# Patient Record
Sex: Male | Born: 1970 | Race: Black or African American | Hispanic: No | Marital: Married | Smoking: Former smoker
Health system: Southern US, Community
[De-identification: ages and names within clinical notes are randomized; demographics above are authoritative.]

## PROBLEM LIST (undated history)

## (undated) ENCOUNTER — Ambulatory Visit

## (undated) DIAGNOSIS — I10 Essential (primary) hypertension: Secondary | ICD-10-CM

## (undated) DIAGNOSIS — B085 Enteroviral vesicular pharyngitis: Secondary | ICD-10-CM

## (undated) HISTORY — DX: Enteroviral vesicular pharyngitis: B08.5

## (undated) HISTORY — PX: BLADDER SURGERY: SHX569

---

## 1999-01-04 ENCOUNTER — Encounter: Payer: Self-pay | Admitting: Emergency Medicine

## 1999-01-04 ENCOUNTER — Emergency Department (HOSPITAL_COMMUNITY): Admission: EM | Admit: 1999-01-04 | Discharge: 1999-01-04 | Payer: Self-pay | Admitting: Emergency Medicine

## 1999-01-05 ENCOUNTER — Emergency Department (HOSPITAL_COMMUNITY): Admission: EM | Admit: 1999-01-05 | Discharge: 1999-01-06 | Payer: Self-pay | Admitting: Emergency Medicine

## 1999-02-09 ENCOUNTER — Emergency Department (HOSPITAL_COMMUNITY): Admission: EM | Admit: 1999-02-09 | Discharge: 1999-02-09 | Payer: Self-pay

## 1999-12-27 ENCOUNTER — Emergency Department (HOSPITAL_COMMUNITY): Admission: EM | Admit: 1999-12-27 | Discharge: 1999-12-27 | Payer: Self-pay | Admitting: Emergency Medicine

## 2011-06-23 ENCOUNTER — Ambulatory Visit: Payer: Self-pay

## 2017-03-07 ENCOUNTER — Encounter (HOSPITAL_COMMUNITY): Payer: Self-pay | Admitting: *Deleted

## 2017-03-07 ENCOUNTER — Emergency Department (HOSPITAL_COMMUNITY)
Admission: EM | Admit: 2017-03-07 | Discharge: 2017-03-07 | Disposition: A | Discharge status: Home / Self Care | Payer: BLUE CROSS/BLUE SHIELD | Attending: Emergency Medicine | Admitting: Emergency Medicine

## 2017-03-07 DIAGNOSIS — Z0289 Encounter for other administrative examinations: Secondary | ICD-10-CM | POA: Insufficient documentation

## 2017-03-07 DIAGNOSIS — Z87891 Personal history of nicotine dependence: Secondary | ICD-10-CM | POA: Insufficient documentation

## 2017-03-07 DIAGNOSIS — Z0283 Encounter for blood-alcohol and blood-drug test: Secondary | ICD-10-CM
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
## 2017-03-07 NOTE — Discharge Instructions (Signed)
Go to lab for drug screening.

## 2018-06-07 ENCOUNTER — Encounter (HOSPITAL_BASED_OUTPATIENT_CLINIC_OR_DEPARTMENT_OTHER): Payer: Self-pay | Admitting: *Deleted

## 2018-06-07 ENCOUNTER — Emergency Department (HOSPITAL_BASED_OUTPATIENT_CLINIC_OR_DEPARTMENT_OTHER)
Admission: EM | Admit: 2018-06-07 | Discharge: 2018-06-07 | Disposition: A | Discharge status: Home / Self Care | Payer: BLUE CROSS/BLUE SHIELD | Attending: Emergency Medicine | Admitting: Emergency Medicine

## 2018-06-07 ENCOUNTER — Other Ambulatory Visit: Payer: Self-pay

## 2018-06-07 ENCOUNTER — Emergency Department (HOSPITAL_BASED_OUTPATIENT_CLINIC_OR_DEPARTMENT_OTHER): Payer: BLUE CROSS/BLUE SHIELD

## 2018-06-07 DIAGNOSIS — M79671 Pain in right foot: Secondary | ICD-10-CM | POA: Insufficient documentation

## 2018-06-07 DIAGNOSIS — R202 Paresthesia of skin: Secondary | ICD-10-CM | POA: Insufficient documentation

## 2018-06-07 DIAGNOSIS — Y9389 Activity, other specified: Secondary | ICD-10-CM | POA: Insufficient documentation

## 2018-06-07 DIAGNOSIS — Y929 Unspecified place or not applicable: Secondary | ICD-10-CM | POA: Insufficient documentation

## 2018-06-07 DIAGNOSIS — I10 Essential (primary) hypertension: Secondary | ICD-10-CM | POA: Insufficient documentation

## 2018-06-07 DIAGNOSIS — Y990 Civilian activity done for income or pay: Secondary | ICD-10-CM | POA: Insufficient documentation

## 2018-06-07 DIAGNOSIS — Z87891 Personal history of nicotine dependence: Secondary | ICD-10-CM | POA: Insufficient documentation

## 2018-06-07 DIAGNOSIS — X503XXA Overexertion from repetitive movements, initial encounter: Secondary | ICD-10-CM | POA: Diagnosis not present

## 2018-06-07 DIAGNOSIS — Z79899 Other long term (current) drug therapy: Secondary | ICD-10-CM | POA: Diagnosis not present

## 2018-06-07 HISTORY — DX: Essential (primary) hypertension: I10

## 2018-06-07 MED ORDER — NAPROXEN 500 MG PO TABS: 500.0000 mg | ORAL_TABLET | Freq: Two times a day (BID) | ORAL | 0 refills | Status: DC

## 2018-06-07 NOTE — ED Triage Notes (Signed)
Pain in his right foot for a few weeks. Pain is worse in the am's after getting up. 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

## 2018-06-07 NOTE — Discharge Instructions (Addendum)
You are seen in the emergency department today for foot pain.  We are unsure of the exact cause of this.  Your x-ray did not show fractures or dislocations.  As discussed this may be due to some irritation of your plantar fascia or due to irritation of a nerve in your foot.  We are sending you home with prescription for naproxen. - Naproxen is a nonsteroidal anti-inflammatory medication that will help with pain and swelling. Be sure to take this medication as prescribed with food, 1 pill every 12 hours,  It should be taken with food, as it can cause stomach upset, and more seriously, stomach bleeding. Do not take other nonsteroidal anti-inflammatory medications with this such as Advil, Motrin, Aleve, Mobic, Goodie Powder, or Motrin.    You make take Tylenol per over the counter dosing with these medications.   We have prescribed you new medication(s) today. Discuss the medications prescribed today with your pharmacist as they can have adverse effects and interactions with your other medicines including over the counter and prescribed medications. Seek medical evaluation if you start to experience new or abnormal symptoms after taking one of these medicines, seek care immediately if you start to experience difficulty breathing, feeling of your throat closing, facial swelling, or rash as these could be indications of a more serious allergic reaction   We recommend wearing comfortable supportive his shoes.  Please purchase heel cups to try to see if this helps with the discomfort.  We would like you to follow-up with your primary care provider or with the sports medicine provider in your discharge instructions in the next week for reevaluation.  Return to the ER for new or worsening symptoms or any other concerns.

## 2018-06-07 NOTE — ED Provider Notes (Signed)
[facility] EMERGENCY DEPARTMENT Provider Note   CSN: [id number] Arrival date & time: 06/07/18  1105     History   Chief Complaint Chief Complaint  Patient presents with  . Foot Pain    HPI Randy Mcdowell is a 47 y.o. male with a hx of HTN who presents to the ED with complaints of intermittent right food pain x 3 weeks. Patient describes the pain as primarily being to the forefoot with associated paresthesias to the digits at times. Pain is almost a burning sensation when it occurs, sometimes sharp. He notes pain is most prominent in the AM when he wakes up and first puts his foot on the ground, he will also notice it with deep plantarflexion type motions. No alleviating factors. No intervention PTA. 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 Denies swelling, redness, weakness, or hx of similar.   HPI  Past Medical History:  Diagnosis Date  . Hypertension     There are no active problems to display for this patient.   Past Surgical History:  Procedure Laterality Date  . BLADDER SURGERY          Home Medications    Prior to Admission medications   Medication Sig Start Date End Date Taking? Authorizing Provider  AMLODIPINE BENZOATE PO Take by mouth.   Yes [provider]    Family History No family history on file.  Social History Social History   Tobacco Use  . Smoking status: Former Games developer  . Smokeless tobacco: Never Used  Substance Use Topics  . Alcohol use: No  . Drug use: No     Allergies   Patient has no known allergies.   Review of Systems Review of Systems  Constitutional: Negative for chills and fever.  Cardiovascular: Negative for leg swelling.  Musculoskeletal: Negative for joint swelling.       Positive for R foot pain  Skin: Negative for color change, pallor, rash and wound.  Neurological: Negative for weakness.       Positive for paresthesias intermittent to R  toes     Physical Exam Updated Vital Signs BP (!) 154/95   Pulse 77   Temp 98.1 F (36.7 C) (Oral)   Resp 20   Ht 6' 2.5" (1.892 m)   Wt 81.6 kg   SpO2 99%   BMI 22.80 kg/m   Physical Exam  Constitutional: He appears well-developed and well-nourished. No distress.  HENT:  Head: Normocephalic and atraumatic.  Eyes: Conjunctivae are normal. Right eye exhibits no discharge. Left eye exhibits no discharge.  Cardiovascular:  Pulses:      Dorsalis pedis pulses are 2+ on the right side, and 2+ on the left side.       Posterior tibial pulses are 2+ on the right side, and 2+ on the left side.  Musculoskeletal:  Lower extremities: No obvious deformity appreciable swelling, erythema, ecchymosis, open wounds, or increased warmth.  Patient has normal active range of motion to bilateral hips, knees, ankles, and all digits of the lower extremities.  No point/focal bony tenderness to palpation.  He does have some slight discomfort with a tarsal squeeze test.  Neurological: He is alert.  Clear speech.  Sensation grossly intact bilateral lower extremities.  5 out of 5 strength plantar dorsiflexion bilaterally.  Ambulatory.  Skin: Skin is warm and dry. Capillary refill takes less than 2 seconds. No rash noted.  Psychiatric: He has  a normal mood and affect. His behavior is normal. Thought content normal.  Nursing note and vitals reviewed.    ED Treatments / Results  Labs (all labs ordered are listed, but only abnormal results are displayed) Labs Reviewed - No data to display  EKG None  Radiology Dg Foot Complete Right  Result Date: 06/07/2018 CLINICAL DATA:  Pain, cramping and numbness throughout mid RIGHT foot for 2-3 weeks, no known injury, symptoms more pronounced after getting out of bed in morning EXAM: RIGHT FOOT COMPLETE - 3+ VIEW COMPARISON:  None FINDINGS: Osseous mineralization normal. Joint spaces preserved. Bone island at proximal first metatarsal. No acute fracture,  dislocation, or bone destruction. IMPRESSION: No acute osseous abnormalities. Electronically Signed   By: Ulyses SouthwardMark  Boles M.D.   On: 06/07/2018 11:41    Procedures Procedures (including critical care time)  Medications Ordered in ED Medications - No data to display   Initial Impression / Assessment and Plan / ED Course  I have reviewed the triage vital signs and the nursing notes.  Pertinent labs & imaging results that were available during my care of the patient were reviewed by me and considered in my medical decision making (see chart for details).   Presents to the emergency department with intermittent right foot pain with associated intermittent paresthesias.  Patient nontoxic-appearing, no apparent distress. Fairly benign physical exam. No fevers or overlying erythema/warmth to suggest infectious process such as osteomyelitis, cellulitis, or septic joint. Xray per triage negative for fracture/dislocation. Calf is not edematous or swollen to raise concern for DVT. Considering plantar fascitis given worse in the AM, but odd distribution without heel pain, also considering morton neuroma given location and paresthesias. Overall NVI distally. Does not seem to be an emergent process. Discharge with naproxen, recommended heel cups and comfortable/supportive footwear, sports med/PCP follow up. I discussed results, treatment plan, need for follow-up, and return precautions with the patient. Provided opportunity for questions, patient confirmed understanding and is in agreement with plan.   Final Clinical Impressions(s) / ED Diagnoses   Final diagnoses:  Right foot pain    ED Discharge Orders         Ordered    naproxen (NAPROSYN) 500 MG tablet  2 times daily     06/07/18 [address]1346           Petrucelli, AdamsSamantha R, PA-C 06/07/18 1347    Benjiman CorePickering, Nathan, MD 06/08/18 [phone]

## 2019-02-27 ENCOUNTER — Telehealth: Payer: Self-pay | Admitting: Hematology and Oncology

## 2019-02-27 NOTE — Telephone Encounter (Signed)
[facility] [place] [facility] Mr. Randy Mcdowell returned my call and has been scheduled to see Dr. Lindi Adie on 9/2 at 915am. Pt needed an early morning appt d/t his work schedule. Aware to arrive 15 minutes early.

## 2019-03-12 NOTE — Progress Notes (Signed)
[facility] CONSULT NOTE  Patient Care Team: Long, Lorin PicketScott, PA-C as PCP - General (Physician Assistant)  CHIEF COMPLAINTS/PURPOSE OF CONSULTATION:  Newly diagnosed neutropenia  HISTORY OF PRESENTING ILLNESS:  Randy Mcdowell 48 y.o. male is here because of recent diagnosis of neutropenia. He was referred by Lindaann PascalScott Long, PA at [facility]. Labs on 02/12/19 showed: WBC 3.0, ANC 1.3, Hg 13.9, platelets 262,000. He presents to the clinic today for initial evaluation and discussion of treatment. No infections.  I reviewed his records extensively and collaborated the history with the patient.  MEDICAL HISTORY:  Past Medical History:  Diagnosis Date  . Hypertension     SURGICAL HISTORY: Past Surgical History:  Procedure Laterality Date  . BLADDER SURGERY      SOCIAL HISTORY: Social History   Socioeconomic History  . Marital status: Married    Spouse name: Not on file  . Number of children: Not on file  . Years of education: Not on file  . Highest education level: Not on file  Occupational History  . Not on file  Social Needs  . Financial resource strain: Not on file  . Food insecurity    Worry: Not on file    Inability: Not on file  . Transportation needs    Medical: Not on file    Non-medical: Not on file  Tobacco Use  . Smoking status: Former Games developermoker  . Smokeless tobacco: Never Used  Substance and Sexual Activity  . Alcohol use: No  . Drug use: No  . Sexual activity: Not on file  Lifestyle  . Physical activity    Days per week: Not on file    Minutes per session: Not on file  . Stress: Not on file  Relationships  . Social Musicianconnections    Talks on phone: Not on file    Gets together: Not on file    Attends religious service: Not on file    Active member of club or organization: Not on file    Attends meetings of clubs or organizations: Not on file    Relationship status: Not on file  . Intimate partner violence    Fear of current or ex  partner: Not on file    Emotionally abused: Not on file    Physically abused: Not on file    Forced sexual activity: Not on file  Other Topics Concern  . Not on file  Social History Narrative  . Not on file    FAMILY HISTORY: No family history on file.  ALLERGIES:  has No Known Allergies.  MEDICATIONS:  Current Outpatient Medications  Medication Sig Dispense Refill  . AMLODIPINE BENZOATE PO Take by mouth.    . naproxen (NAPROSYN) 500 MG tablet Take 1 tablet (500 mg total) by mouth 2 (two) times daily. 30 tablet 0   No current facility-administered medications for this visit.     REVIEW OF SYSTEMS:   Constitutional: Denies fevers, chills or abnormal night sweats Eyes: Denies blurriness of vision, double vision or watery eyes Ears, nose, mouth, throat, and face: Denies mucositis or sore throat Respiratory: Denies cough, dyspnea or wheezes Cardiovascular: Denies palpitation, chest discomfort or lower extremity swelling Gastrointestinal:  Denies nausea, heartburn or change in bowel habits Skin: Denies abnormal skin rashes Lymphatics: Denies new lymphadenopathy or easy bruising Neurological:Denies numbness, tingling or new weaknesses Behavioral/Psych: Mood is stable, no new changes  All other systems were reviewed with the patient and are negative.  PHYSICAL EXAMINATION: ECOG  PERFORMANCE STATUS: 1 - Symptomatic but completely ambulatory  Vitals:   03/13/19 0952  BP: (!) 175/78  Pulse: (!) 107  Resp: 18  Temp: 98.7 F (37.1 C)  SpO2: 100%   Filed Weights   03/13/19 0952  Weight: 176 lb 3.2 oz (79.9 kg)    GENERAL:alert, no distress and comfortable SKIN: skin color, texture, turgor are normal, no rashes or significant lesions EYES: normal, conjunctiva are pink and non-injected, sclera clear OROPHARYNX:no exudate, no erythema and lips, buccal mucosa, and tongue normal  NECK: supple, thyroid normal size, non-tender, without nodularity LYMPH:  no palpable  lymphadenopathy in the cervical, axillary or inguinal LUNGS: clear to auscultation and percussion with normal breathing effort HEART: regular rate & rhythm and no murmurs and no lower extremity edema ABDOMEN:abdomen soft, non-tender and normal bowel sounds Musculoskeletal:no cyanosis of digits and no clubbing  PSYCH: alert & oriented x 3 with fluent speech NEURO: no focal motor/sensory deficits   LABORATORY DATA:  I have reviewed the data as listed No results found for: WBC, HGB, HCT, MCV, PLT No results found for: NA, K, CL, CO2  RADIOGRAPHIC STUDIES: I have personally reviewed the radiological reports and agreed with the findings in the report.  ASSESSMENT AND PLAN:  Leukopenia Lab review: WBC 3, ANC 1.33, ALC 1, hemoglobin 13.9, platelets 261 CMP is within normal limits Mild neutropenia: We do not have any prior labs to compare his results. He does not have any problems with infections. 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  Differential diagnosis: 1. Infections: Especially viruses like HIV 2. inflammation/autoimmune causes including lupus/rheumatoid arthritis 3. Nutritional causes but X-32 or folic acid deficiency 4. Medication induced 5. Bone marrow disorders  Workup today: 1. CBC with differential with smear 2. G-40 and folic acid levels 3. ANA  I would like to see his trend of white blood cell count before proceeding with any bone marrow biopsies.  He is completely asymptomatic.  Return to clinic in 6 months with labs and follow-up.  All questions were answered. The patient knows to call the clinic with any problems, questions or concerns.   Rulon Eisenmenger, MD 03/13/2019    I, Molly Dorshimer, am acting as scribe for Nicholas Lose, MD.  I have reviewed the above documentation for accuracy and completeness, and I agree with the above.

## 2019-03-13 ENCOUNTER — Telehealth: Payer: Self-pay

## 2019-03-13 ENCOUNTER — Telehealth: Payer: Self-pay | Admitting: Hematology and Oncology

## 2019-03-13 ENCOUNTER — Other Ambulatory Visit: Payer: Self-pay

## 2019-03-13 ENCOUNTER — Inpatient Hospital Stay: Payer: BC Managed Care – PPO | Attending: Hematology and Oncology | Admitting: Hematology and Oncology

## 2019-03-13 DIAGNOSIS — Z87891 Personal history of nicotine dependence: Secondary | ICD-10-CM

## 2019-03-13 DIAGNOSIS — D709 Neutropenia, unspecified: Secondary | ICD-10-CM | POA: Diagnosis not present

## 2019-03-13 DIAGNOSIS — D72819 Decreased white blood cell count, unspecified: Secondary | ICD-10-CM | POA: Insufficient documentation

## 2019-03-13 MED ORDER — AMLODIPINE BESYLATE 5 MG PO TABS: 5.0000 mg | ORAL_TABLET | Freq: Every day | ORAL | Status: DC

## 2019-03-13 NOTE — Telephone Encounter (Signed)
Pt is on the waitlist for sooner appt. I called to offer an appt with Dr. Brett Fairy for 03/14/19 at 3:00pm. No answer, left a message asking him to call back. If this appt is still open, please offer it to him.

## 2019-03-13 NOTE — Assessment & Plan Note (Signed)
Lab review: WBC 3, ANC 1.33, ALC 1, hemoglobin 13.9, platelets 261 CMP is within normal limits Mild neutropenia: We do not have any prior labs to compare his results. He does not have any problems with infections. 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  Differential diagnosis: 1. Infections: Especially viruses like HIV 2. inflammation/autoimmune causes including lupus/rheumatoid arthritis 3. Nutritional causes but H-08 or folic acid deficiency 4. Medication induced 5. Bone marrow disorders  Workup today: 1. CBC with differential with smear 2. M-57 and folic acid levels 3. ANA  I would like to see his trend of white blood cell count before proceeding with any bone marrow biopsies.  He is completely asymptomatic.  Return to clinic in 3 months with labs and follow-up.

## 2019-03-13 NOTE — Telephone Encounter (Signed)
Pt called back and declined the 09-03 appointment

## 2019-03-13 NOTE — Telephone Encounter (Signed)
I left a message regarding schedule  

## 2019-03-14 ENCOUNTER — Inpatient Hospital Stay: Payer: BC Managed Care – PPO

## 2019-03-14 ENCOUNTER — Other Ambulatory Visit: Payer: Self-pay

## 2019-03-14 DIAGNOSIS — D709 Neutropenia, unspecified: Secondary | ICD-10-CM

## 2019-03-14 LAB — CBC WITH DIFFERENTIAL (CANCER CENTER ONLY)
Abs Immature Granulocytes: 0.01 10*3/uL (ref 0.00–0.07)
Basophils Absolute: 0 10*3/uL (ref 0.0–0.1)
Basophils Relative: 1 %
Eosinophils Absolute: 0.3 10*3/uL (ref 0.0–0.5)
Eosinophils Relative: 7 %
HCT: 43.2 % (ref 39.0–52.0)
Hemoglobin: 13.8 g/dL (ref 13.0–17.0)
Immature Granulocytes: 0 %
Lymphocytes Relative: 33 %
Lymphs Abs: 1.2 10*3/uL (ref 0.7–4.0)
MCH: 27.4 pg (ref 26.0–34.0)
MCHC: 31.9 g/dL (ref 30.0–36.0)
MCV: 85.9 fL (ref 80.0–100.0)
Monocytes Absolute: 0.4 10*3/uL (ref 0.1–1.0)
Monocytes Relative: 11 %
Neutro Abs: 1.8 10*3/uL (ref 1.7–7.7)
Neutrophils Relative %: 48 %
Platelet Count: 294 10*3/uL (ref 150–400)
RBC: 5.03 MIL/uL (ref 4.22–5.81)
RDW: 13.4 % (ref 11.5–15.5)
WBC Count: 3.7 10*3/uL — ABNORMAL LOW (ref 4.0–10.5)
nRBC: 0 % (ref 0.0–0.2)

## 2019-03-14 LAB — FOLATE: Folate: 7.7 ng/mL (ref >5.9)

## 2019-03-14 LAB — VITAMIN B12: Vitamin B-12: 275 pg/mL (ref 180–914)

## 2019-03-15 LAB — ANTINUCLEAR ANTIBODIES, IFA: ANA Ab, IFA: NEGATIVE

## 2019-04-02 ENCOUNTER — Other Ambulatory Visit: Payer: Self-pay

## 2019-04-02 ENCOUNTER — Encounter

## 2019-04-02 ENCOUNTER — Other Ambulatory Visit: Payer: Self-pay | Admitting: *Deleted

## 2019-04-02 ENCOUNTER — Encounter: Payer: Self-pay | Admitting: Diagnostic Neuroimaging

## 2019-04-02 ENCOUNTER — Ambulatory Visit (INDEPENDENT_AMBULATORY_CARE_PROVIDER_SITE_OTHER): Payer: BC Managed Care – PPO | Admitting: Diagnostic Neuroimaging

## 2019-04-02 ENCOUNTER — Encounter: Payer: Self-pay | Admitting: *Deleted

## 2019-04-02 VITALS — BP 143/84 | HR 62 | Temp 98.0°F | Ht 75.0 in | Wt 176.8 lb

## 2019-04-02 DIAGNOSIS — R200 Anesthesia of skin: Secondary | ICD-10-CM | POA: Diagnosis not present

## 2019-04-02 NOTE — Patient Instructions (Signed)
-   monitor for continued improvement over next 1-2 months

## 2019-04-02 NOTE — Progress Notes (Signed)
[facility]  PATIENT: Randy Mcdowell DOB: 1970-09-04  REFERRING CLINICIAN: S Long HISTORY FROM: patient  REASON FOR VISIT: new consult    HISTORICAL  CHIEF COMPLAINT:  Chief Complaint  Patient presents with  . Numbness    rm 7 New Pt, "intermittent numbness in my feet x 1-2 months"    HISTORY OF PRESENT ILLNESS:   48 year old male here for evaluation of numbness in feet.  Symptoms started in July 2020.  Symptoms peaked in August 2020.  Since that time symptoms have spontaneously improved.  Around that time patient had noted some swelling in feet, possible related to blood pressure medication side effect.  Dosage was adjusted and swelling in legs reduced.  Around the same time numbness improved.  Possible aggravating factor includes change in job for the past 4 months, working at [employer], with more time lifting heavy items and spending time in his feet.  No problems in fingers or hands.  No problems in face, vision speech or swallowing.  Symptoms have improved but are still intermittent.  He describes some numbness, tingling, needle sensation mainly in the toes and feet.  Also patient was noted to have low white blood cell count, has followed up with PCP and hematology clinic for follow-up.  No specific diagnosis has been found.    REVIEW OF SYSTEMS: Full 14 system review of systems performed and negative with exception of: As per HPI.  ALLERGIES: No Known Allergies  HOME MEDICATIONS: Outpatient Medications Prior to Visit  Medication Sig Dispense Refill  . amLODipine (NORVASC) 10 MG tablet TK 1 T PO QD    . olmesartan (BENICAR) 20 MG tablet olmesartan 20 mg tablet  TK 1 T PO QD    . diphenhydrAMINE (BENADRYL ALLERGY) 25 MG tablet Benadryl Allergy 25 mg tablet  as directed otc version    . loratadine (CLARITIN) 10 MG tablet loratadine 10 mg tablet  TK 1 T PO QD PRN    . olopatadine (PATANOL) 0.1 % ophthalmic solution olopatadine 0.1 % eye drops  INSTILL  1 DROP INTO AFFECTED EYE(S) BY OPHTHALMIC ROUTE 2 TIMES PER DAY AT AN INTERVAL OF 6 TO 8 HOURS    . omeprazole (PRILOSEC) 20 MG capsule TK 1 C PO QD    . naproxen (NAPROSYN) 500 MG tablet Take 1 tablet (500 mg total) by mouth 2 (two) times daily. 30 tablet 0   No facility-administered medications prior to visit.     PAST MEDICAL HISTORY: Past Medical History:  Diagnosis Date  . Hypertension     PAST SURGICAL HISTORY: Past Surgical History:  Procedure Laterality Date  . BLADDER SURGERY     age 11-9  . HERNIA REPAIR     umbilical    FAMILY HISTORY: Family History  Problem Relation Age of Onset  . Diabetes Father   . Diabetes Maternal Grandmother   . Hypertension Mother   . Hypertension Brother     SOCIAL HISTORY: Social History   Socioeconomic History  . Marital status: Married    Spouse name: Not on file  . Number of children: 5  . Years of education: Not on file  . Highest education level: Bachelor's degree (e.g., BA, AB, BS)  Occupational History    Comment: [employer]  Social Needs  . Financial resource strain: Not on file  . Food insecurity    Worry: Not on file    Inability: Not on file  . Transportation needs    Medical: Not on file  Non-medical: Not on file  Tobacco Use  . Smoking status: Former Smoker    Quit date: 04/02/1999    Years since quitting: 20.0  . Smokeless tobacco: Never Used  Substance and Sexual Activity  . Alcohol use: No    Comment: quit 2015  . Drug use: Never  . Sexual activity: Not on file  Lifestyle  . Physical activity    Days per week: Not on file    Minutes per session: Not on file  . Stress: Not on file  Relationships  . Social Musician on phone: Not on file    Gets together: Not on file    Attends religious service: Not on file    Active member of club or organization: Not on file    Attends meetings of clubs or organizations: Not on file    Relationship status: Not on file  . Intimate partner violence     Fear of current or ex partner: Not on file    Emotionally abused: Not on file    Physically abused: Not on file    Forced sexual activity: Not on file  Other Topics Concern  . Not on file  Social History Narrative   Lives with wife, 5 children   Caffeine- few sodas a week     PHYSICAL EXAM  GENERAL EXAM/CONSTITUTIONAL: Vitals:  Vitals:   04/02/19 1545  BP: (!) 143/84  Pulse: 62  Temp: 98 F (36.7 C)  Weight: 176 lb 12.8 oz (80.2 kg)  Height: 6\' 3"  (1.905 m)     Body mass index is 22.1 kg/m. Wt Readings from Last 3 Encounters:  04/02/19 176 lb 12.8 oz (80.2 kg)  03/13/19 176 lb 3.2 oz (79.9 kg)  06/07/18 180 lb (81.6 kg)     Patient is in no distress; well developed, nourished and groomed; neck is supple  CARDIOVASCULAR:  Examination of carotid arteries is normal; no carotid bruits  Regular rate and rhythm, no murmurs  Examination of peripheral vascular system by observation and palpation is normal  EYES:  Ophthalmoscopic exam of optic discs and posterior segments is normal; no papilledema or hemorrhages  No exam data present  MUSCULOSKELETAL:  Gait, strength, tone, movements noted in Neurologic exam below  NEUROLOGIC: MENTAL STATUS:  No flowsheet data found.  awake, alert, oriented to person, place and time  recent and remote memory intact  normal attention and concentration  language fluent, comprehension intact, naming intact  fund of knowledge appropriate  CRANIAL NERVE:   2nd - no papilledema on fundoscopic exam  2nd, 3rd, 4th, 6th - pupils equal and reactive to light, visual fields full to confrontation, extraocular muscles intact, no nystagmus  5th - facial sensation symmetric  7th - facial strength symmetric  8th - hearing intact  9th - palate elevates symmetrically, uvula midline  11th - shoulder shrug symmetric  12th - tongue protrusion midline  MOTOR:   normal bulk and tone, full strength in the BUE, BLE  SENSORY:    normal and symmetric to light touch, pinprick, temperature, vibration  COORDINATION:   finger-nose-finger, fine finger movements normal  REFLEXES:   deep tendon reflexes TRACE and symmetric  GAIT/STATION:   narrow based gait; romberg is negative     DIAGNOSTIC DATA (LABS, IMAGING, TESTING) - I reviewed patient records, labs, notes, testing and imaging myself where available.  Lab Results  Component Value Date   WBC 3.7 (L) 03/14/2019   HGB 13.8 03/14/2019   HCT 43.2  03/14/2019   MCV 85.9 03/14/2019   PLT 294 03/14/2019   No results found for: NA, K, CL, CO2, GLUCOSE, BUN, CREATININE, CALCIUM, PROT, ALBUMIN, AST, ALT, ALKPHOS, BILITOT, GFRNONAA, GFRAA No results found for: CHOL, HDL, LDLCALC, LDLDIRECT, TRIG, CHOLHDL No results found for: MPNT6R Lab Results  Component Value Date   VITAMINB12 275 03/14/2019   No results found for: TSH     ASSESSMENT AND PLAN  48 y.o. year old male here with new onset numbness and tingling in toes and feet since July 2020, now spontaneous improving.  Dx:  1. Numbness in feet     PLAN:  NUMBNESS IN FEET (July-Aug 2020; spontaneously improving) - could be related to new job (on feet more in last 4 months), post-viral phenomenon, metabolic, autoimmune, inflamm - monitor for continued improvement - may consider EMG/NCS and addl neuropathy lab testing in future if worsens or fails to resolve  LEUKOPENIA - follow up with CBC and heme/onc  Return for pending if symptoms worsen or fail to improve.    Suanne Marker, MD 04/02/2019, 4:17 PM Certified in Neurology, Neurophysiology and Neuroimaging  [facility] [address] [place], [place] [address] [phone]

## 2019-07-09 IMAGING — CR DG FOOT COMPLETE 3+V*R*
3 series · 3 of 3 positions shown · non-contrast
Comparison: None

CLINICAL DATA: Pain, cramping and numbness throughout mid RIGHT
foot for 2-3 weeks, no known injury, symptoms more pronounced after
getting out of bed in morning

EXAM:
RIGHT FOOT COMPLETE - 3+ VIEW

[t foot ap right]
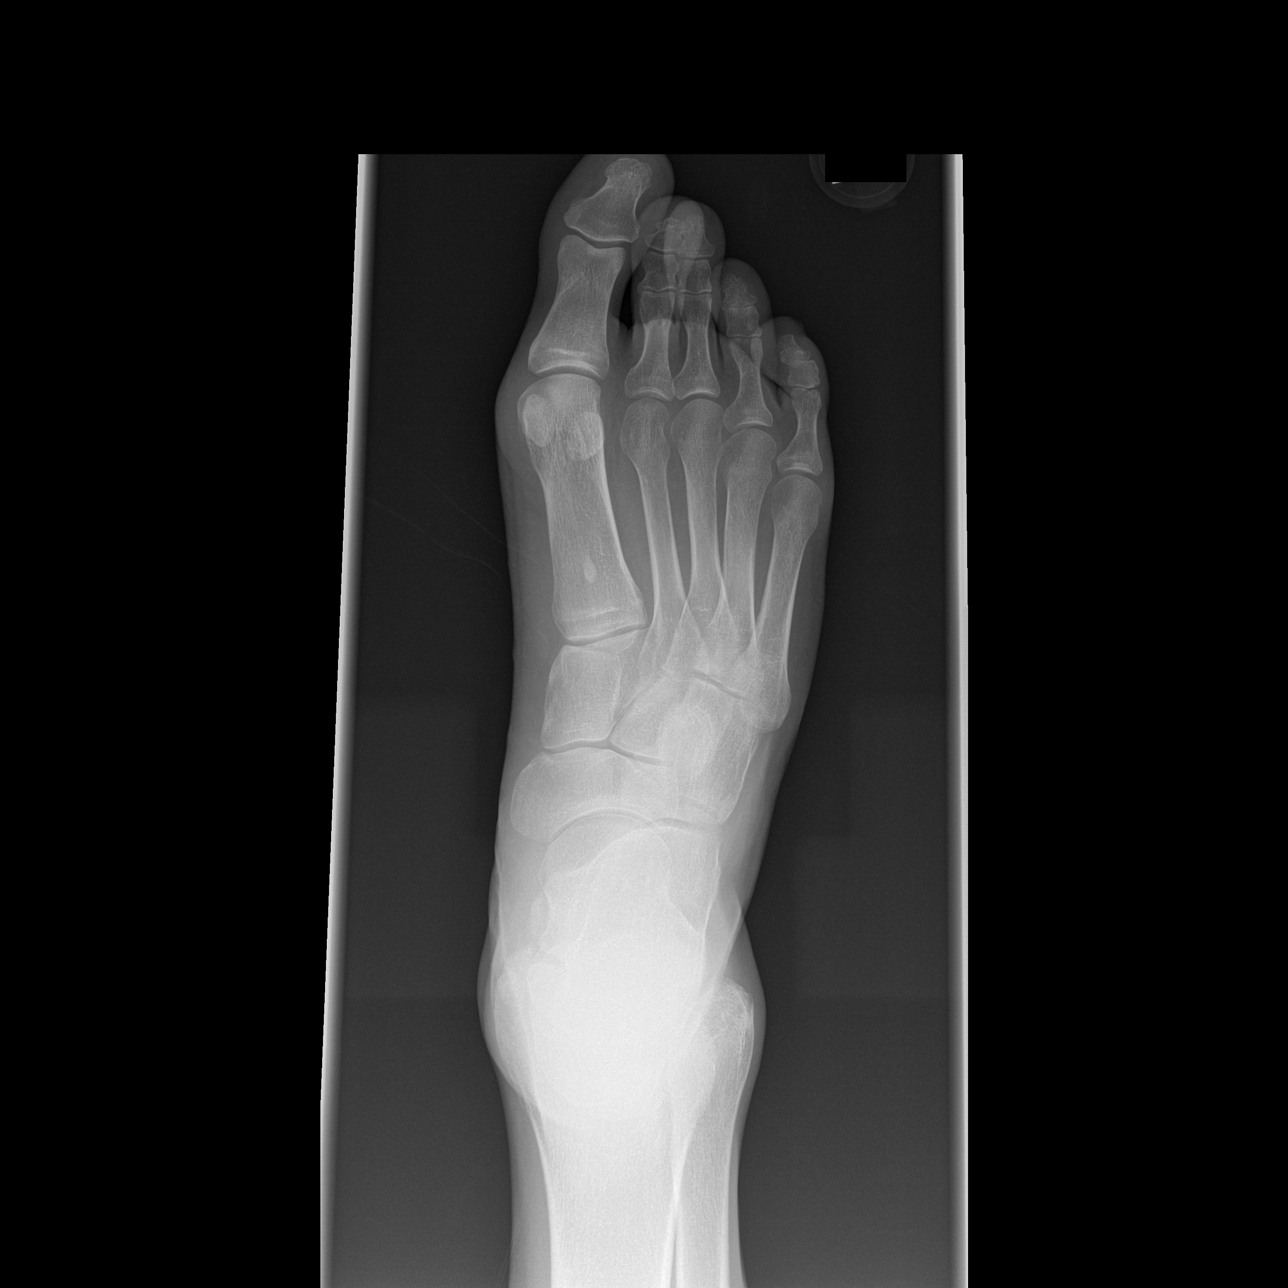

[t foot oblique right]
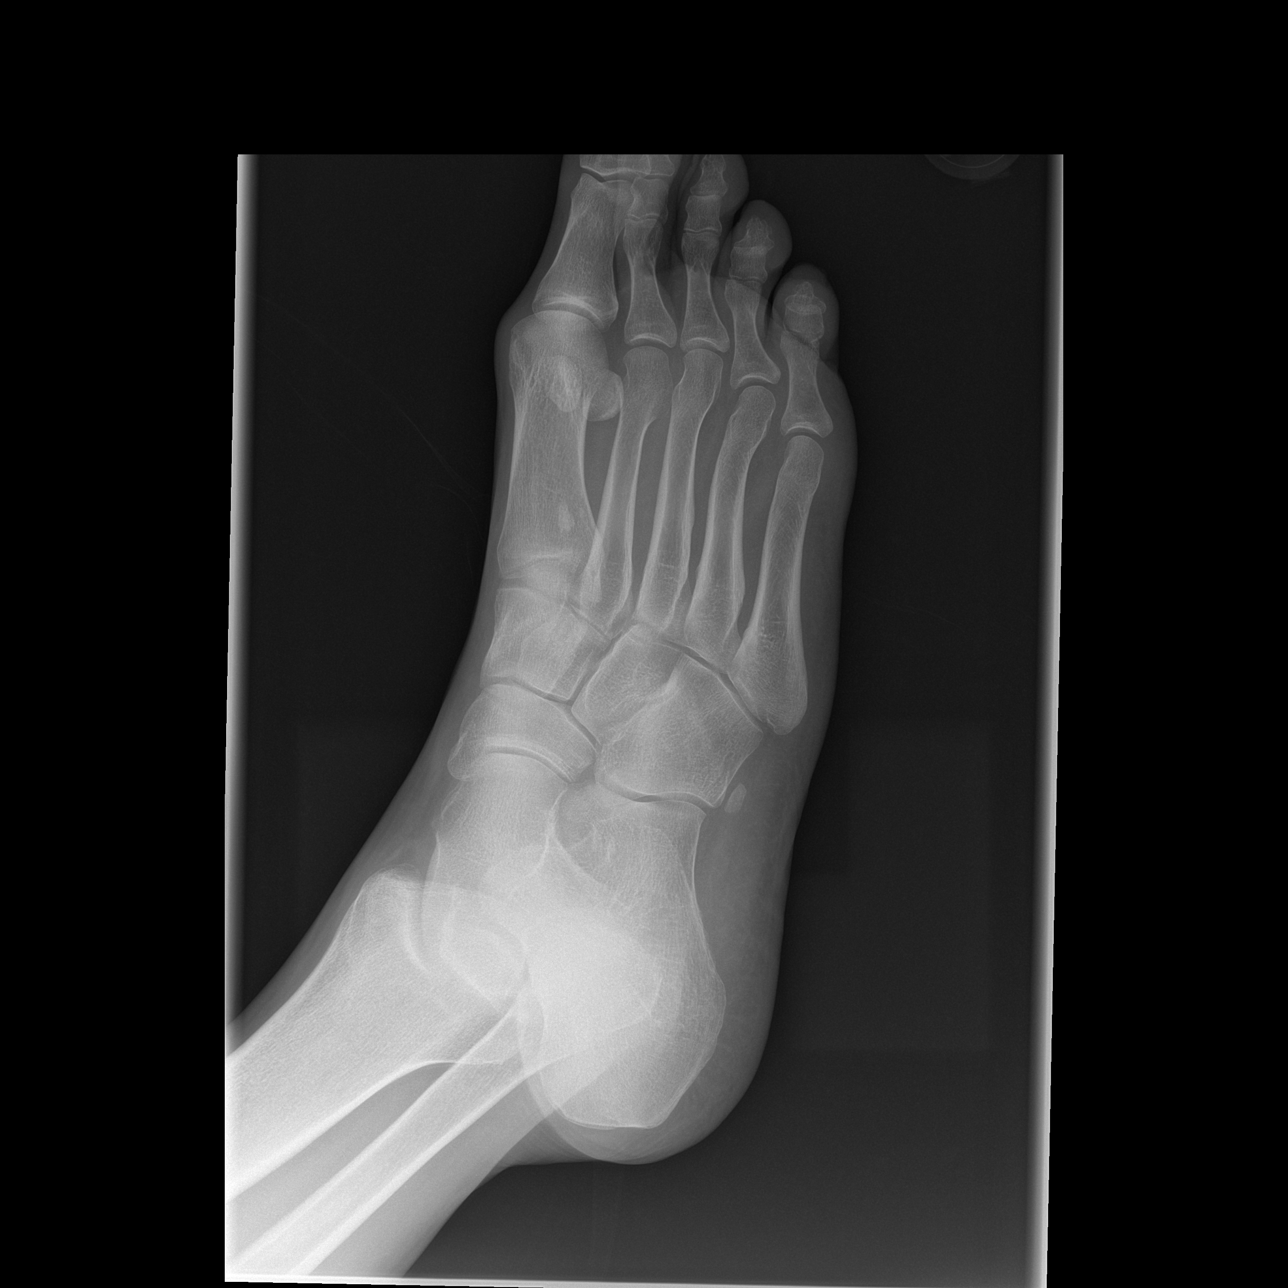

[t foot lat right]
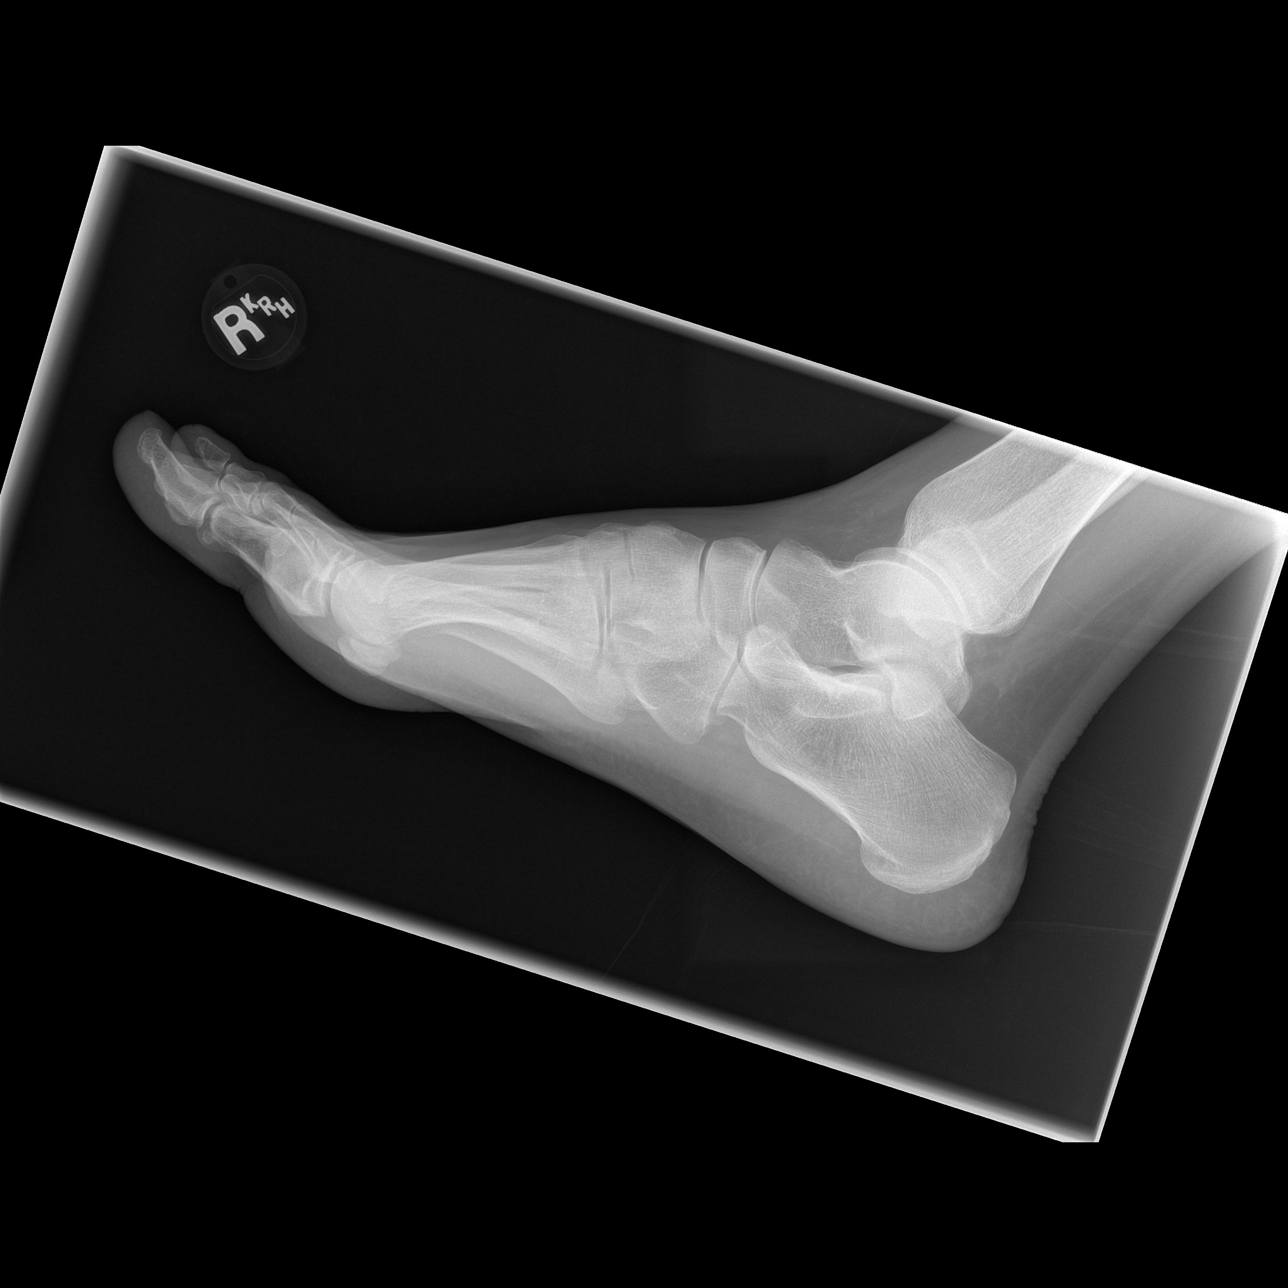

[3 of 3 positions shown; findings below may reference images not displayed]

FINDINGS: Osseous mineralization normal.

Joint spaces preserved.

Bone island at proximal first metatarsal.

No acute fracture, dislocation, or bone destruction.
IMPRESSION: No acute osseous abnormalities.

## 2019-09-10 ENCOUNTER — Other Ambulatory Visit: Payer: Self-pay | Admitting: *Deleted

## 2019-09-10 DIAGNOSIS — D709 Neutropenia, unspecified: Secondary | ICD-10-CM

## 2019-09-11 ENCOUNTER — Inpatient Hospital Stay: Payer: BC Managed Care – PPO | Attending: Hematology and Oncology

## 2019-09-11 ENCOUNTER — Inpatient Hospital Stay (HOSPITAL_BASED_OUTPATIENT_CLINIC_OR_DEPARTMENT_OTHER): Payer: BC Managed Care – PPO | Admitting: Hematology and Oncology

## 2019-09-11 ENCOUNTER — Other Ambulatory Visit: Payer: Self-pay

## 2019-09-11 DIAGNOSIS — D72819 Decreased white blood cell count, unspecified: Secondary | ICD-10-CM | POA: Insufficient documentation

## 2019-09-11 DIAGNOSIS — D709 Neutropenia, unspecified: Secondary | ICD-10-CM | POA: Diagnosis not present

## 2019-09-11 LAB — CBC WITH DIFFERENTIAL (CANCER CENTER ONLY)
Abs Immature Granulocytes: 0.01 10*3/uL (ref 0.00–0.07)
Basophils Absolute: 0 10*3/uL (ref 0.0–0.1)
Basophils Relative: 1 %
Eosinophils Absolute: 0.4 10*3/uL (ref 0.0–0.5)
Eosinophils Relative: 9 %
HCT: 46 % (ref 39.0–52.0)
Hemoglobin: 14.8 g/dL (ref 13.0–17.0)
Immature Granulocytes: 0 %
Lymphocytes Relative: 33 %
Lymphs Abs: 1.5 10*3/uL (ref 0.7–4.0)
MCH: 27.4 pg (ref 26.0–34.0)
MCHC: 32.2 g/dL (ref 30.0–36.0)
MCV: 85.2 fL (ref 80.0–100.0)
Monocytes Absolute: 0.5 10*3/uL (ref 0.1–1.0)
Monocytes Relative: 10 %
Neutro Abs: 2.2 10*3/uL (ref 1.7–7.7)
Neutrophils Relative %: 47 %
Platelet Count: 256 10*3/uL (ref 150–400)
RBC: 5.4 MIL/uL (ref 4.22–5.81)
RDW: 12.8 % (ref 11.5–15.5)
WBC Count: 4.6 10*3/uL (ref 4.0–10.5)
nRBC: 0 % (ref 0.0–0.2)

## 2019-09-11 NOTE — Assessment & Plan Note (Signed)
03/14/2019: ANA negative, B12 275, folic acid 7.7, WBC 3.7 rest of CBC is normal I suspect the cause of the mild leukopenia to be due to cyclical/ethnicity related causes.  09/11/2019:  Patient is completely asymptomatic. Return to clinic on as-needed basis

## 2019-09-11 NOTE — Progress Notes (Signed)
   Patient Care Team: Lindaann Pascal, PA-C as PCP - General (Physician Assistant)  DIAGNOSIS:    ICD-10-CM   1. Neutropenia, unspecified type (HCC)  D70.9    CHIEF COMPLIANT: Follow-up of neutropenia  INTERVAL HISTORY: Randy Mcdowell is a 49 y.o. with above-mentioned history of neutropenia. She presents to the clinic today for follow-up and labs. He reports no new problems or concerns. He does not have any fevers or chills.  ALLERGIES:  has No Known Allergies.  MEDICATIONS:  Current Outpatient Medications  Medication Sig Dispense Refill  . amLODipine (NORVASC) 10 MG tablet TK 1 T PO QD    . diphenhydrAMINE (BENADRYL ALLERGY) 25 MG tablet Benadryl Allergy 25 mg tablet  as directed otc version    . loratadine (CLARITIN) 10 MG tablet loratadine 10 mg tablet  TK 1 T PO QD PRN    . olmesartan (BENICAR) 20 MG tablet olmesartan 20 mg tablet  TK 1 T PO QD    . olopatadine (PATANOL) 0.1 % ophthalmic solution olopatadine 0.1 % eye drops  INSTILL 1 DROP INTO AFFECTED EYE(S) BY OPHTHALMIC ROUTE 2 TIMES PER DAY AT AN INTERVAL OF 6 TO 8 HOURS    . omeprazole (PRILOSEC) 20 MG capsule TK 1 C PO QD     No current facility-administered medications for this visit.    PHYSICAL EXAMINATION: ECOG PERFORMANCE STATUS: 1 - Symptomatic but completely ambulatory  Vitals:   09/11/19 1533  BP: (!) 144/87  Pulse: 74  Resp: 18  Temp: 98.3 F (36.8 C)  SpO2: 100%   Filed Weights   09/11/19 1533  Weight: 188 lb 12.8 oz (85.6 kg)    LABORATORY DATA:  I have reviewed the data as listed No flowsheet data found.  Lab Results  Component Value Date   WBC 4.6 09/11/2019   HGB 14.8 09/11/2019   HCT 46.0 09/11/2019   MCV 85.2 09/11/2019   PLT 256 09/11/2019   NEUTROABS 2.2 09/11/2019    ASSESSMENT & PLAN:  Leukopenia 03/14/2019: ANA negative, B12 275, folic acid 7.7, WBC 3.7 rest of CBC is normal I suspect the cause of the mild leukopenia to be due to cyclical/ethnicity related causes.  09/11/2019:  WBC count 4.6, complete blood panel is normal with an ANC of 2.2  Patient is completely asymptomatic. Return to clinic on as-needed basis   No orders of the defined types were placed in this encounter.  The patient has a good understanding of the overall plan. he agrees with it. he will call with any problems that may develop before the next visit here.  Total time spent: 20 mins including face to face time and time spent for planning, charting and coordination of care  Serena Croissant, MD 09/11/2019  I, Kirt Boys Dorshimer, am acting as scribe for Dr. Serena Croissant.  I have reviewed the above documentation for accuracy and completeness, and I agree with the above.

## 2021-09-20 ENCOUNTER — Encounter: Payer: Self-pay | Admitting: *Deleted

## 2021-09-20 ENCOUNTER — Other Ambulatory Visit: Payer: Self-pay | Admitting: *Deleted

## 2021-09-22 ENCOUNTER — Ambulatory Visit (INDEPENDENT_AMBULATORY_CARE_PROVIDER_SITE_OTHER): Payer: BC Managed Care – PPO | Admitting: Diagnostic Neuroimaging

## 2021-09-22 ENCOUNTER — Encounter: Payer: Self-pay | Admitting: Diagnostic Neuroimaging

## 2021-09-22 VITALS — BP 148/77 | HR 70 | Ht 75.0 in | Wt 185.0 lb

## 2021-09-22 DIAGNOSIS — R200 Anesthesia of skin: Secondary | ICD-10-CM

## 2021-09-22 NOTE — Progress Notes (Signed)
? ?[facility] ? ?PATIENT: Randy Mcdowell ?DOB: 1971/01/01 ? ?REFERRING CLINICIAN: S Long ?HISTORY FROM: patient  ?REASON FOR VISIT: new consult  ? ? ?HISTORICAL ? ?CHIEF COMPLAINT:  ?Chief Complaint  ?Patient presents with  ? Paresthesias  ?  Rm 6, "numbness in hands/fingers/feet, more in hands now; sometimes goes up my arms"   ? ? ?HISTORY OF PRESENT ILLNESS:  ? ?UPDATE (09/22/21, VRP): Since last visit, doing well until Dec 2022; return of symptoms in feet. Symptoms are mild and now affecting fingers and toes. No pain. No weakness. No balance issues. No vision changes. ? ?PRIOR HPI: 51 year old male here for evaluation of numbness in feet.  Symptoms started in July 2020.  Symptoms peaked in August 2020.  Since that time symptoms have spontaneously improved.  Around that time patient had noted some swelling in feet, possible related to blood pressure medication side effect.  Dosage was adjusted and swelling in legs reduced.  Around the same time numbness improved. ? ?Possible aggravating factor includes change in job for the past 4 months, working at [employer], with more time lifting heavy items and spending time in his feet. ? ?No problems in fingers or hands.  No problems in face, vision speech or swallowing.  Symptoms have improved but are still intermittent.  He describes some numbness, tingling, needle sensation mainly in the toes and feet. ? ?Also patient was noted to have low white blood cell count, has followed up with PCP and hematology clinic for follow-up.  No specific diagnosis has been found. ? ? ? ?REVIEW OF SYSTEMS: Full 14 system review of systems performed and negative with exception of: As per HPI. ? ?ALLERGIES: ?No Known Allergies ? ?HOME MEDICATIONS: ?Outpatient Medications Prior to Visit  ?Medication Sig Dispense Refill  ? amLODipine (NORVASC) 5 MG tablet amlodipine 5 mg tablet ? TAKE 1 TABLET BY MOUTH EVERY DAY    ? olopatadine (PATANOL) 0.1 % ophthalmic solution olopatadine 0.1  % eye drops ? INSTILL 1 DROP INTO AFFECTED EYE(S) BY OPHTHALMIC ROUTE 2 TIMES PER DAY AT AN INTERVAL OF 6 TO 8 HOURS    ? ?No facility-administered medications prior to visit.  ? ? ?PAST MEDICAL HISTORY: ?Past Medical History:  ?Diagnosis Date  ? Herpangina   ? Hypertension   ? ? ?PAST SURGICAL HISTORY: ?Past Surgical History:  ?Procedure Laterality Date  ? BLADDER SURGERY    ? age [age]  ? HERNIA REPAIR    ? umbilical  ? ? ?FAMILY HISTORY: ?Family History  ?Problem Relation Age of Onset  ? Hypertension Mother   ? Hypertension Father   ? Diabetes Father   ? Hypertension Brother   ? Diabetes Maternal Grandmother   ? Diabetes Paternal Grandmother   ? ? ?SOCIAL HISTORY: ?Social History  ? ?Socioeconomic History  ? Marital status: Married  ?  Spouse name: Crystal  ? Number of children: 5  ? Years of education: Not on file  ? Highest education level: Bachelor's degree (e.g., BA, AB, BS)  ?Occupational History  ?  Comment: [employer]  ?Tobacco Use  ? Smoking status: Former  ?  Types: Cigarettes  ?  Quit date: 04/02/1999  ?  Years since quitting: 22.4  ? Smokeless tobacco: Never  ?Substance and Sexual Activity  ? Alcohol use: No  ?  Comment: quit 2015  ? Drug use: Never  ? Sexual activity: Not on file  ?Other Topics Concern  ? Not on file  ?Social History Narrative  ? Lives  with wife, 5 children  ? Caffeine- few sodas a week  ? ?Social Determinants of Health  ? ?Financial Resource Strain: Not on file  ?Food Insecurity: Not on file  ?Transportation Needs: Not on file  ?Physical Activity: Not on file  ?Stress: Not on file  ?Social Connections: Not on file  ?Intimate Partner Violence: Not on file  ? ? ? ?PHYSICAL EXAM ? ?GENERAL EXAM/CONSTITUTIONAL: ?Vitals:  ?Vitals:  ? 09/22/21 0933  ?BP: (!) 148/77  ?Pulse: 70  ?Weight: 185 lb (83.9 kg)  ?Height: _0  (1.905 m)  ? ?Body mass index is 23.12 kg/m?. ?Wt Readings from Last 3 Encounters:  ?09/22/21 185 lb (83.9 kg)  ?09/11/19 188 lb 12.8 oz (85.6 kg)  ?04/02/19 176 lb 12.8 oz (80.2  kg)  ? ?Patient is in no distress; well developed, nourished and groomed; neck is supple ? ?CARDIOVASCULAR: ?Examination of carotid arteries is normal; no carotid bruits ?Regular rate and rhythm, no murmurs ?Examination of peripheral vascular system by observation and palpation is normal ? ?EYES: ?Ophthalmoscopic exam of optic discs and posterior segments is normal; no papilledema or hemorrhages ?No results found. ? ?MUSCULOSKELETAL: ?Gait, strength, tone, movements noted in Neurologic exam below ? ?NEUROLOGIC: ?MENTAL STATUS:  ?No flowsheet data found. ?awake, alert, oriented to person, place and time ?recent and remote memory intact ?normal attention and concentration ?language fluent, comprehension intact, naming intact ?fund of knowledge appropriate ? ?CRANIAL NERVE:  ?2nd - no papilledema on fundoscopic exam ?2nd, 3rd, 4th, 6th - pupils equal and reactive to light, visual fields full to confrontation, extraocular muscles intact, no nystagmus ?5th - facial sensation symmetric ?7th - facial strength symmetric ?8th - hearing intact ?9th - palate elevates symmetrically, uvula midline ?11th - shoulder shrug symmetric ?12th - tongue protrusion midline ? ?MOTOR:  ?normal bulk and tone, full strength in the BUE, BLE ? ?SENSORY:  ?normal and symmetric to light touch, pinprick, temperature, vibration ? ?COORDINATION:  ?finger-nose-finger, fine finger movements normal ? ?REFLEXES:  ?deep tendon reflexes TRACE and symmetric ? ?GAIT/STATION:  ?narrow based gait ? ? ? ? ?DIAGNOSTIC DATA (LABS, IMAGING, TESTING) ?- I reviewed patient records, labs, notes, testing and imaging myself where available. ? ?Lab Results  ?Component Value Date  ? WBC 4.6 09/11/2019  ? HGB 14.8 09/11/2019  ? HCT 46.0 09/11/2019  ? MCV 85.2 09/11/2019  ? PLT 256 09/11/2019  ? ?No results found for: NA, K, CL, CO2, GLUCOSE, BUN, CREATININE, CALCIUM, PROT, ALBUMIN, AST, ALT, ALKPHOS, BILITOT, GFRNONAA, GFRAA ?No results found for: CHOL, HDL, LDLCALC,  LDLDIRECT, TRIG, CHOLHDL ?No results found for: HGBA1C ?Lab Results  ?Component Value Date  ? [id number] 275 03/14/2019  ? ?No results found for: TSH ? ? ? ? ?ASSESSMENT AND PLAN ? ?51 y.o. year old male here with new onset numbness and tingling. ? ?Dx: ? ?1. Numbness   ? ? ? ?PLAN: ? ?NUMBNESS IN FINGERS / TOES (July-Aug 2020; spontaneously resolved; returned in Sept 2022) ?- check neuropathy labs ?- check MRI brain (demyelinating dz workup) ? ?Orders Placed This Encounter  ?Procedures  ? MR BRAIN W WO CONTRAST  ? CBC with diff  ? CMP  ? Vitamin B12  ? MMA  ? Homocysteine  ? A1c  ? TSH  ? SPEP with IFE  ? ANA w/Reflex  ? SSA, SSB  ? ESR  ? CRP  ? HIV  ? RPR  ? ANCA Profile  ? Vitamin B6  ? Vitamin B1  ? Hepatitis C antibody  ?  Hepatitis B core antibody, total  ? Hepatitis B surface antigen  ? Hepatitis B surface antibody, qualitative  ? ?Return for pending if symptoms worsen or fail to improve, pending test results. ? ? ? ?Penni Bombard, MD 8/93/7342, 87:68 AM ?Certified in Neurology, Neurophysiology and Neuroimaging ? ?[facility] ?[address], [address] ?[place], [place] [address] ?[phone] ? ?

## 2021-09-30 LAB — HEPATITIS B CORE ANTIBODY, TOTAL

## 2021-09-30 LAB — HEPATITIS B SURFACE ANTIGEN

## 2021-09-30 LAB — HEPATITIS C ANTIBODY

## 2021-09-30 LAB — HEPATITIS B SURFACE ANTIBODY,QUALITATIVE

## 2021-10-04 LAB — HOMOCYSTEINE: Homocysteine: 18.5 umol/L — ABNORMAL HIGH (ref 0.0–14.5)

## 2021-10-04 LAB — ANCA PROFILE
Anti-MPO Antibodies: <0.2 units (ref 0.0–0.9)
Anti-PR3 Antibodies: <0.2 units (ref 0.0–0.9)
Atypical pANCA: <1:20 {titer}
C-ANCA: <1:20 {titer}
P-ANCA: <1:20 {titer}

## 2021-10-04 LAB — CBC WITH DIFFERENTIAL/PLATELET
Basophils Absolute: 0 10*3/uL (ref 0.0–0.2)
Basos: 1 %
EOS (ABSOLUTE): 0.2 10*3/uL (ref 0.0–0.4)
Eos: 6 %
Hematocrit: 48.1 % (ref 37.5–51.0)
Hemoglobin: 16 g/dL (ref 13.0–17.7)
Immature Grans (Abs): 0 10*3/uL (ref 0.0–0.1)
Immature Granulocytes: 0 %
Lymphocytes Absolute: 0.9 10*3/uL (ref 0.7–3.1)
Lymphs: 27 %
MCH: 27.8 pg (ref 26.6–33.0)
MCHC: 33.3 g/dL (ref 31.5–35.7)
MCV: 84 fL (ref 79–97)
Monocytes Absolute: 0.3 10*3/uL (ref 0.1–0.9)
Monocytes: 9 %
Neutrophils Absolute: 1.9 10*3/uL (ref 1.4–7.0)
Neutrophils: 57 %
Platelets: 238 10*3/uL (ref 150–450)
RBC: 5.75 x10E6/uL (ref 4.14–5.80)
RDW: 13.1 % (ref 11.6–15.4)
WBC: 3.4 10*3/uL (ref 3.4–10.8)

## 2021-10-04 LAB — MULTIPLE MYELOMA PANEL, SERUM
Albumin SerPl Elph-Mcnc: 4.5 g/dL — ABNORMAL HIGH (ref 2.9–4.4)
Albumin/Glob SerPl: 1.8 — ABNORMAL HIGH (ref 0.7–1.7)
Alpha 1: 0.2 g/dL (ref 0.0–0.4)
Alpha2 Glob SerPl Elph-Mcnc: 0.6 g/dL (ref 0.4–1.0)
B-Globulin SerPl Elph-Mcnc: 0.8 g/dL (ref 0.7–1.3)
Gamma Glob SerPl Elph-Mcnc: 1 g/dL (ref 0.4–1.8)
Globulin, Total: 2.6 g/dL (ref 2.2–3.9)
IgA/Immunoglobulin A, Serum: 80 mg/dL — ABNORMAL LOW (ref 90–386)
IgG (Immunoglobin G), Serum: 1009 mg/dL (ref 603–1613)
IgM (Immunoglobulin M), Srm: 43 mg/dL (ref 20–172)

## 2021-10-04 LAB — COMPREHENSIVE METABOLIC PANEL
ALT: 32 IU/L (ref 0–44)
AST: 51 IU/L — ABNORMAL HIGH (ref 0–40)
Albumin/Globulin Ratio: 2.7 — ABNORMAL HIGH (ref 1.2–2.2)
Albumin: 5.2 g/dL — ABNORMAL HIGH (ref 3.8–4.9)
Alkaline Phosphatase: 98 IU/L (ref 44–121)
BUN/Creatinine Ratio: 15 (ref 9–20)
BUN: 17 mg/dL (ref 6–24)
Bilirubin Total: 0.9 mg/dL (ref 0.0–1.2)
CO2: 28 mmol/L (ref 20–29)
Calcium: 9.9 mg/dL (ref 8.7–10.2)
Chloride: 100 mmol/L (ref 96–106)
Creatinine, Ser: 1.15 mg/dL (ref 0.76–1.27)
Globulin, Total: 1.9 g/dL (ref 1.5–4.5)
Glucose: 99 mg/dL (ref 70–99)
Potassium: 4 mmol/L (ref 3.5–5.2)
Sodium: 140 mmol/L (ref 134–144)
Total Protein: 7.1 g/dL (ref 6.0–8.5)
eGFR: 77 mL/min/{1.73_m2} (ref >59)

## 2021-10-04 LAB — SJOGREN'S SYNDROME ANTIBODS(SSA + SSB)
ENA SSA (RO) Ab: <0.2 AI (ref 0.0–0.9)
ENA SSB (LA) Ab: <0.2 AI (ref 0.0–0.9)

## 2021-10-04 LAB — VITAMIN B12: Vitamin B-12: 370 pg/mL (ref 232–1245)

## 2021-10-04 LAB — TSH: TSH: 1.08 u[IU]/mL (ref 0.450–4.500)

## 2021-10-04 LAB — ANA W/REFLEX: ANA Titer 1: NEGATIVE

## 2021-10-04 LAB — C-REACTIVE PROTEIN: CRP: <1 mg/L (ref 0–10)

## 2021-10-04 LAB — VITAMIN B1: Thiamine: 111.8 nmol/L (ref 66.5–200.0)

## 2021-10-04 LAB — HEMOGLOBIN A1C
Est. average glucose Bld gHb Est-mCnc: 126 mg/dL
Hgb A1c MFr Bld: 6 % — ABNORMAL HIGH (ref 4.8–5.6)

## 2021-10-04 LAB — HIV ANTIBODY (ROUTINE TESTING W REFLEX): HIV Screen 4th Generation wRfx: NONREACTIVE

## 2021-10-04 LAB — VITAMIN B6: Vitamin B6: 10.7 ug/L (ref 3.4–65.2)

## 2021-10-04 LAB — METHYLMALONIC ACID, SERUM: Methylmalonic Acid: 367 nmol/L (ref 0–378)

## 2021-10-04 LAB — SEDIMENTATION RATE: Sed Rate: 6 mm/hr (ref 0–30)

## 2021-10-04 LAB — RPR: RPR Ser Ql: NONREACTIVE

## 2021-10-11 ENCOUNTER — Telehealth: Payer: Self-pay | Admitting: Diagnostic Neuroimaging

## 2021-10-11 NOTE — Telephone Encounter (Signed)
LVM asking pt to call back and schedule MRI brain w/wo contrast. 

## 2021-10-13 ENCOUNTER — Telehealth: Payer: Self-pay | Admitting: Diagnostic Neuroimaging

## 2021-10-13 NOTE — Telephone Encounter (Signed)
LVM for pt to call back to schedule EE  ?10/11/21 LVM asking pt to call back and schedule JM ?10/06/21 [facility] auth: [employer] spoke to [place] Ref # [id number] EE ?

## 2021-10-14 NOTE — Telephone Encounter (Signed)
Patient returned my call he is scheduled at [facility] for 10/20/21.  ?

## 2021-10-18 ENCOUNTER — Telehealth: Payer: Self-pay

## 2021-10-18 NOTE — Telephone Encounter (Signed)
-----   Message from Suanne Marker, MD sent at 10/14/2021  8:26 PM EDT ----- ?Unremarkable labs overall. Mild abnl noted (sugar, protein, liver) but very mild and borderline and not likely significant. Can follow up wioth PCP. Some hepatitis panel pending. Continue current plan. Please call patient. -VRP ?

## 2021-10-18 NOTE — Telephone Encounter (Signed)
I called pt. No answer, left a message asking pt to call me back.   

## 2021-10-18 NOTE — Telephone Encounter (Signed)
Pt returned my call and we discussed. He verbalized understanding and appreciation for the call. Pt advised we will update once hepatitis panel is finalized.  ?

## 2021-10-20 ENCOUNTER — Ambulatory Visit: Payer: BC Managed Care – PPO

## 2021-10-20 DIAGNOSIS — R200 Anesthesia of skin: Secondary | ICD-10-CM | POA: Diagnosis not present

## 2021-10-20 MED ORDER — GADOBENATE DIMEGLUMINE 529 MG/ML IV SOLN
17.0000 mL | Freq: Once | INTRAVENOUS | Status: AC | PRN
  Administered 2021-10-20: 17 mL via INTRAVENOUS

## 2021-10-27 ENCOUNTER — Telehealth: Payer: Self-pay

## 2021-10-27 NOTE — Telephone Encounter (Signed)
Contacted pt, LVM per DPR, informing him MRI was unremarkable, no concerns, advised to call office back with questions.  ?

## 2021-10-27 NOTE — Telephone Encounter (Signed)
-----   Message from Suanne Marker, MD sent at 10/26/2021  6:02 PM EDT ----- ?Unremarkable imaging results. Please call patient. Continue current plan. -VRP ?
